# Patient Record
Sex: Female | Born: 1975 | Hispanic: No | Smoking: Never smoker
Health system: Southern US, Community
[De-identification: ages and names within clinical notes are randomized; demographics above are authoritative.]

## PROBLEM LIST (undated history)

## (undated) DIAGNOSIS — G43909 Migraine, unspecified, not intractable, without status migrainosus: Secondary | ICD-10-CM

## (undated) DIAGNOSIS — F419 Anxiety disorder, unspecified: Secondary | ICD-10-CM

## (undated) DIAGNOSIS — F329 Major depressive disorder, single episode, unspecified: Secondary | ICD-10-CM

## (undated) DIAGNOSIS — N289 Disorder of kidney and ureter, unspecified: Secondary | ICD-10-CM

## (undated) DIAGNOSIS — F32A Depression, unspecified: Secondary | ICD-10-CM

## (undated) HISTORY — PX: OOPHORECTOMY: SHX86

## (undated) HISTORY — PX: GASTRIC BYPASS: SHX52

---

## 2014-01-13 ENCOUNTER — Emergency Department (HOSPITAL_BASED_OUTPATIENT_CLINIC_OR_DEPARTMENT_OTHER)

## 2014-01-13 ENCOUNTER — Emergency Department (HOSPITAL_BASED_OUTPATIENT_CLINIC_OR_DEPARTMENT_OTHER)
Admission: EM | Admit: 2014-01-13 | Discharge: 2014-01-13 | Disposition: A | Discharge status: Home / Self Care | Attending: Emergency Medicine | Admitting: Emergency Medicine

## 2014-01-13 ENCOUNTER — Encounter (HOSPITAL_BASED_OUTPATIENT_CLINIC_OR_DEPARTMENT_OTHER): Payer: Self-pay | Admitting: Emergency Medicine

## 2014-01-13 DIAGNOSIS — R51 Headache: Secondary | ICD-10-CM | POA: Insufficient documentation

## 2014-01-13 DIAGNOSIS — Z8679 Personal history of other diseases of the circulatory system: Secondary | ICD-10-CM | POA: Insufficient documentation

## 2014-01-13 DIAGNOSIS — Z3202 Encounter for pregnancy test, result negative: Secondary | ICD-10-CM | POA: Insufficient documentation

## 2014-01-13 DIAGNOSIS — R519 Headache, unspecified: Secondary | ICD-10-CM

## 2014-01-13 DIAGNOSIS — Z79899 Other long term (current) drug therapy: Secondary | ICD-10-CM | POA: Insufficient documentation

## 2014-01-13 HISTORY — DX: Migraine, unspecified, not intractable, without status migrainosus: G43.909

## 2014-01-13 LAB — PREGNANCY, URINE: PREG TEST UR: NEGATIVE

## 2014-01-13 LAB — CBC WITH DIFFERENTIAL/PLATELET
Basophils Absolute: 0 10*3/uL (ref 0.0–0.1)
Basophils Relative: 0 % (ref 0–1)
Eosinophils Absolute: 0.1 10*3/uL (ref 0.0–0.7)
Eosinophils Relative: 1 % (ref 0–5)
HEMATOCRIT: 35.9 % — AB (ref 36.0–46.0)
Hemoglobin: 11.4 g/dL — ABNORMAL LOW (ref 12.0–15.0)
LYMPHS PCT: 44 % (ref 12–46)
Lymphs Abs: 3.2 10*3/uL (ref 0.7–4.0)
MCH: 27.3 pg (ref 26.0–34.0)
MCHC: 31.8 g/dL (ref 30.0–36.0)
MCV: 86.1 fL (ref 78.0–100.0)
MONO ABS: 0.5 10*3/uL (ref 0.1–1.0)
Monocytes Relative: 7 % (ref 3–12)
NEUTROS ABS: 3.4 10*3/uL (ref 1.7–7.7)
Neutrophils Relative %: 48 % (ref 43–77)
PLATELETS: 316 10*3/uL (ref 150–400)
RBC: 4.17 MIL/uL (ref 3.87–5.11)
RDW: 12.2 % (ref 11.5–15.5)
WBC: 7.3 10*3/uL (ref 4.0–10.5)

## 2014-01-13 LAB — BASIC METABOLIC PANEL
ANION GAP: 18 — AB (ref 5–15)
BUN: 5 mg/dL — ABNORMAL LOW (ref 6–23)
CHLORIDE: 96 meq/L (ref 96–112)
CO2: 24 meq/L (ref 19–32)
Calcium: 10.1 mg/dL (ref 8.4–10.5)
Creatinine, Ser: 0.7 mg/dL (ref 0.50–1.10)
GFR calc Af Amer: >90 mL/min (ref >90)
GFR calc non Af Amer: >90 mL/min (ref >90)
Glucose, Bld: 88 mg/dL (ref 70–99)
Potassium: 3.7 mEq/L (ref 3.7–5.3)
SODIUM: 138 meq/L (ref 137–147)

## 2014-01-13 MED ORDER — DEXAMETHASONE SODIUM PHOSPHATE 10 MG/ML IJ SOLN: 10.0000 mg | Freq: Once | INTRAMUSCULAR | Status: AC

## 2014-01-13 MED ORDER — KETOROLAC TROMETHAMINE 30 MG/ML IJ SOLN
30.0000 mg | Freq: Once | INTRAMUSCULAR | Status: AC
  Administered 2014-01-13: 30 mg via INTRAVENOUS
  Filled 2014-01-13: qty 1

## 2014-01-13 MED ORDER — METOCLOPRAMIDE HCL 5 MG/ML IJ SOLN
10.0000 mg | Freq: Once | INTRAMUSCULAR | Status: AC
  Administered 2014-01-13: 10 mg via INTRAVENOUS
  Filled 2014-01-13: qty 2

## 2014-01-13 MED ORDER — PROMETHAZINE HCL 25 MG PO TABS: 25.0000 mg | ORAL_TABLET | Freq: Four times a day (QID) | ORAL | Status: AC | PRN

## 2014-01-13 MED ORDER — BUTALBITAL-APAP-CAFFEINE 50-325-40 MG PO TABS: 1.0000 | ORAL_TABLET | Freq: Four times a day (QID) | ORAL | Status: AC | PRN

## 2014-01-13 MED ORDER — DEXAMETHASONE SODIUM PHOSPHATE 4 MG/ML IJ SOLN
INTRAMUSCULAR | Status: AC
Start: 2014-01-13 — End: 2014-01-13
  Administered 2014-01-13: 10 mg
  Filled 2014-01-13: qty 3

## 2014-01-13 MED ORDER — DIPHENHYDRAMINE HCL 50 MG/ML IJ SOLN
25.0000 mg | Freq: Once | INTRAMUSCULAR | Status: AC
  Administered 2014-01-13: 25 mg via INTRAVENOUS
  Filled 2014-01-13: qty 1

## 2014-01-13 MED ORDER — SUMATRIPTAN SUCCINATE 6 MG/0.5ML ~~LOC~~ SOLN
6.0000 mg | Freq: Once | SUBCUTANEOUS | Status: AC
  Administered 2014-01-13: 6 mg via SUBCUTANEOUS
  Filled 2014-01-13: qty 0.5

## 2014-01-13 MED ORDER — SODIUM CHLORIDE 0.9 % IV BOLUS (SEPSIS)
1000.0000 mL | Freq: Once | INTRAVENOUS | Status: AC
  Administered 2014-01-13: 1000 mL via INTRAVENOUS

## 2014-01-13 MED ORDER — IBUPROFEN 800 MG PO TABS
800.0000 mg | ORAL_TABLET | Freq: Three times a day (TID) | ORAL | Status: AC
Start: 2014-01-13 — End: ?

## 2014-01-13 NOTE — ED Provider Notes (Signed)
CSN: [id number]     Arrival date & time 01/13/14  1512 History   First MD Initiated Contact with Patient 01/13/14 1521     Chief Complaint  Patient presents with  . Headache     (Consider location/radiation/quality/duration/timing/severity/associated sxs/prior Treatment) HPI Comments: Patient presents to ER for evaluation of migraine headache for 1 week. Patient reports that she has had a bifrontal throbbing headache with light sensitivity for one week. She was seen at an urgent care a couple of days ago, given Toradol, Phenergan and Benadryl but did not have any relief. Patient reports that she has a history of migraines, but has never had one for this long before. She is not expressing any fever, neck pain or stiffness.  Patient is a 38 y.o. female presenting with headaches.  Headache Associated symptoms: no fever     Past Medical History  Diagnosis Date  . Migraine    Past Surgical History  Procedure Laterality Date  . Gastric bypass    . Oophorectomy     No family history on file. History  Substance Use Topics  . Smoking status: Never Smoker   . Smokeless tobacco: Not on file  . Alcohol Use: Yes     Comment: occ   OB History   Grav Para Term Preterm Abortions TAB SAB Ect Mult Living                 Review of Systems  Constitutional: Negative for fever.  Neurological: Positive for headaches.  All other systems reviewed and are negative.     Allergies  Review of patient's allergies indicates no known allergies.  Home Medications   Prior to Admission medications   Medication Sig Start Date End Date Taking? Authorizing Provider  levonorgestrel-ethinyl estradiol (SEASONALE,INTROVALE,JOLESSA) 0.15-0.03 MG tablet Take 1 tablet by mouth daily.   Yes Historical Provider, MD   BP 109/73  Pulse 61  Temp(Src) 98.2 F (36.8 C) (Oral)  Resp 18  Ht 5\' 1"  (1.549 m)  Wt 120 lb (54.432 kg)  BMI 22.69 kg/m2  SpO2 100%  LMP 01/06/2014 Physical Exam  Constitutional:  She is oriented to person, place, and time. She appears well-developed and well-nourished. No distress.  HENT:  Head: Normocephalic and atraumatic.  Right Ear: Hearing normal.  Left Ear: Hearing normal.  Nose: Nose normal.  Mouth/Throat: Oropharynx is clear and moist and mucous membranes are normal.  Eyes: Conjunctivae and EOM are normal. Pupils are equal, round, and reactive to light.  Neck: Normal range of motion. Neck supple.  Cardiovascular: Regular rhythm, S1 normal and S2 normal.  Exam reveals no gallop and no friction rub.   No murmur heard. Pulmonary/Chest: Effort normal and breath sounds normal. No respiratory distress. She exhibits no tenderness.  Abdominal: Soft. Normal appearance and bowel sounds are normal. There is no hepatosplenomegaly. There is no tenderness. There is no rebound, no guarding, no tenderness at McBurney's point and negative Murphy's sign. No hernia.  Musculoskeletal: Normal range of motion.  Neurological: She is alert and oriented to person, place, and time. She has normal strength. No cranial nerve deficit or sensory deficit. Coordination normal. GCS eye subscore is 4. GCS verbal subscore is 5. GCS motor subscore is 6.  Skin: Skin is warm, dry and intact. No rash noted. No cyanosis.  Psychiatric: She has a normal mood and affect. Her speech is normal and behavior is normal. Thought content normal.    ED Course  Procedures (including critical care time) Labs Review Labs Reviewed  CBC WITH DIFFERENTIAL - Abnormal; Notable for the following:    Hemoglobin 11.4 (*)    HCT 35.9 (*)    All other components within normal limits  BASIC METABOLIC PANEL - Abnormal; Notable for the following:    BUN 5 (*)    Anion gap 18 (*)    All other components within normal limits  PREGNANCY, URINE    Imaging Review Ct Head Wo Contrast  01/13/2014   CLINICAL DATA:  Headache.  EXAM: CT HEAD WITHOUT CONTRAST  TECHNIQUE: Contiguous axial images were obtained from the base of  the skull through the vertex without intravenous contrast.  COMPARISON:  None.  FINDINGS: Bony calvarium appears intact. No mass effect or midline shift is noted. Ventricular size is within normal limits. There is no evidence of mass lesion, hemorrhage or acute infarction.  IMPRESSION: Normal head CT.   Electronically Signed   By: Roque Lias M.D.   On: 01/13/2014 16:23     EKG Interpretation None      MDM   Final diagnoses:  None   headache, likely migraine  Patient with previous history of migraines presents to the ER for evaluation of a headache that has lasted one week. She reports that the length of the headaches he has had is unusual for her, her headaches have never been this long. The description of the headache, however, does resemble migraine headache tonight she has light sensitivity, nausea, bifrontal throbbing. She had a normal neurologic examination. A head CT was performed because of the unusual nature of her headache and was negative.  I did discuss the possibility of lumbar puncture with the patient both at initial evaluation as well as after results of testing was available. Patient was informed that I could not rule out subarachnoid hemorrhage with 100% certainty with CAT scan alone based on the length of time her head has been hurting. She was informed of the risks of lumbar puncture, specifically post LP headache. Patient does not wish to undergo the procedure at this time. I did feel that the patient was extremely low likelihood for subarachnoid hemorrhage based on her presentation and examination, and it was agreed that the patient would be discharged, told to come back to the ER for headache worsens. She will be referred to neurology.    Gilda Crease, MD 01/13/14 1640

## 2014-01-13 NOTE — Discharge Instructions (Signed)
If Headache suddenly worsens or you develop numbness, tingling, weakness in the face or one side of your body, return to ER for repeat patient.  Migraine Headache A migraine headache is an intense, throbbing pain on one or both sides of your head. A migraine can last for 30 minutes to several hours. CAUSES  The exact cause of a migraine headache is not always known. However, a migraine may be caused when nerves in the brain become irritated and release chemicals that cause inflammation. This causes pain. Certain things may also trigger migraines, such as:  Alcohol.  Smoking.  Stress.  Menstruation.  Aged cheeses.  Foods or drinks that contain nitrates, glutamate, aspartame, or tyramine.  Lack of sleep.  Chocolate.  Caffeine.  Hunger.  Physical exertion.  Fatigue.  Medicines used to treat chest pain (nitroglycerine), birth control pills, estrogen, and some blood pressure medicines. SIGNS AND SYMPTOMS  Pain on one or both sides of your head.  Pulsating or throbbing pain.  Severe pain that prevents daily activities.  Pain that is aggravated by any physical activity.  Nausea, vomiting, or both.  Dizziness.  Pain with exposure to bright lights, loud noises, or activity.  General sensitivity to bright lights, loud noises, or smells. Before you get a migraine, you may get warning signs that a migraine is coming (aura). An aura may include:  Seeing flashing lights.  Seeing bright spots, halos, or zig-zag lines.  Having tunnel vision or blurred vision.  Having feelings of numbness or tingling.  Having trouble talking.  Having muscle weakness. DIAGNOSIS  A migraine headache is often diagnosed based on:  Symptoms.  Physical exam.  A CT scan or MRI of your head. These imaging tests cannot diagnose migraines, but they can help rule out other causes of headaches. TREATMENT Medicines may be given for pain and nausea. Medicines can also be given to help prevent  recurrent migraines.  HOME CARE INSTRUCTIONS  Only take over-the-counter or prescription medicines for pain or discomfort as directed by your health care provider. The use of long-term narcotics is not recommended.  Lie down in a dark, quiet room when you have a migraine.  Keep a journal to find out what may trigger your migraine headaches. For example, write down:  What you eat and drink.  How much sleep you get.  Any change to your diet or medicines.  Limit alcohol consumption.  Quit smoking if you smoke.  Get 7-9 hours of sleep, or as recommended by your health care provider.  Limit stress.  Keep lights dim if bright lights bother you and make your migraines worse. SEEK IMMEDIATE MEDICAL CARE IF:   Your migraine becomes severe.  You have a fever.  You have a stiff neck.  You have vision loss.  You have muscular weakness or loss of muscle control.  You start losing your balance or have trouble walking.  You feel faint or pass out.  You have severe symptoms that are different from your first symptoms. MAKE SURE YOU:   Understand these instructions.  Will watch your condition.  Will get help right away if you are not doing well or get worse. Document Released: 06/16/2005 Document Revised: 04/06/2013 Document Reviewed: 02/21/2013 [facility] Patient Information 2015 [employer], [place]. This information is not intended to replace advice given to you by your health care provider. Make sure you discuss any questions you have with your health care provider.

## 2014-01-13 NOTE — ED Notes (Signed)
Migraine x 1 week. Sts went to clinic, given toradol, phenergan and benadryl, no relief. Frontal, left headache. Never this bad.

## 2014-06-27 ENCOUNTER — Encounter (HOSPITAL_BASED_OUTPATIENT_CLINIC_OR_DEPARTMENT_OTHER): Payer: Self-pay | Admitting: *Deleted

## 2014-06-27 ENCOUNTER — Emergency Department (HOSPITAL_BASED_OUTPATIENT_CLINIC_OR_DEPARTMENT_OTHER)
Admission: EM | Admit: 2014-06-27 | Discharge: 2014-06-27 | Disposition: A | Discharge status: Home / Self Care | Attending: Emergency Medicine | Admitting: Emergency Medicine

## 2014-06-27 DIAGNOSIS — F419 Anxiety disorder, unspecified: Secondary | ICD-10-CM | POA: Insufficient documentation

## 2014-06-27 DIAGNOSIS — Q600 Renal agenesis, unilateral: Secondary | ICD-10-CM | POA: Insufficient documentation

## 2014-06-27 DIAGNOSIS — F329 Major depressive disorder, single episode, unspecified: Secondary | ICD-10-CM | POA: Insufficient documentation

## 2014-06-27 DIAGNOSIS — R300 Dysuria: Secondary | ICD-10-CM | POA: Diagnosis not present

## 2014-06-27 DIAGNOSIS — Z3202 Encounter for pregnancy test, result negative: Secondary | ICD-10-CM | POA: Diagnosis not present

## 2014-06-27 DIAGNOSIS — Z792 Long term (current) use of antibiotics: Secondary | ICD-10-CM | POA: Diagnosis not present

## 2014-06-27 DIAGNOSIS — N390 Urinary tract infection, site not specified: Secondary | ICD-10-CM | POA: Diagnosis not present

## 2014-06-27 DIAGNOSIS — Z8679 Personal history of other diseases of the circulatory system: Secondary | ICD-10-CM | POA: Insufficient documentation

## 2014-06-27 DIAGNOSIS — R109 Unspecified abdominal pain: Secondary | ICD-10-CM | POA: Diagnosis present

## 2014-06-27 HISTORY — DX: Anxiety disorder, unspecified: F41.9

## 2014-06-27 HISTORY — DX: Major depressive disorder, single episode, unspecified: F32.9

## 2014-06-27 HISTORY — DX: Disorder of kidney and ureter, unspecified: N28.9

## 2014-06-27 HISTORY — DX: Depression, unspecified: F32.A

## 2014-06-27 LAB — URINALYSIS, ROUTINE W REFLEX MICROSCOPIC
Glucose, UA: NEGATIVE mg/dL
Hgb urine dipstick: NEGATIVE
Ketones, ur: >80 mg/dL — AB
NITRITE: POSITIVE — AB
Protein, ur: 30 mg/dL — AB
SPECIFIC GRAVITY, URINE: 1.028 (ref 1.005–1.030)
UROBILINOGEN UA: 4 mg/dL — AB (ref 0.0–1.0)
pH: 5 (ref 5.0–8.0)

## 2014-06-27 LAB — PREGNANCY, URINE: PREG TEST UR: NEGATIVE

## 2014-06-27 LAB — URINE MICROSCOPIC-ADD ON

## 2014-06-27 MED ORDER — CEPHALEXIN 500 MG PO CAPS: 500.0000 mg | ORAL_CAPSULE | Freq: Three times a day (TID) | ORAL | Status: AC

## 2014-06-27 MED ORDER — HYDROCODONE-ACETAMINOPHEN 5-325 MG PO TABS
1.0000 | ORAL_TABLET | Freq: Once | ORAL | Status: AC
  Administered 2014-06-27: 1 via ORAL
  Filled 2014-06-27: qty 1

## 2014-06-27 MED ORDER — CEFTRIAXONE SODIUM 1 G IJ SOLR
1.0000 g | Freq: Once | INTRAMUSCULAR | Status: AC
  Administered 2014-06-27: 1 g via INTRAMUSCULAR
  Filled 2014-06-27: qty 10

## 2014-06-27 NOTE — Discharge Instructions (Signed)
Take Tylenol for pain, stay well-hydrated. Change your antibiotics to Keflex, stop taking Cipro. Follow-up for culture results in 2 days with local physician.  If you were given medicines take as directed.  If you are on coumadin or contraceptives realize their levels and effectiveness is altered by many different medicines.  If you have any reaction (rash, tongues swelling, other) to the medicines stop taking and see a physician.   Please follow up as directed and return to the ER or see a physician for new or worsening symptoms.  Thank you. Filed Vitals:   06/27/14 1816  BP: 108/93  Pulse: 72  Temp: 98.4 F (36.9 C)  TempSrc: Oral  Height: 4\' 10"  (1.473 m)  Weight: 108 lb (48.988 kg)  SpO2: 100%

## 2014-06-27 NOTE — ED Notes (Signed)
Pt c/o left flank pain x 2 days DX UTI x 1 dayo taking cipro

## 2014-06-27 NOTE — ED Notes (Signed)
EDP at bedside  

## 2014-06-27 NOTE — ED Provider Notes (Addendum)
CSN: [id number]     Arrival date & time 06/27/14  1807 History  This chart was scribed for Enid SkeensJoshua M Maryhelen Lindler, MD by Roxy Cedarhandni Bhalodia, ED Scribe. This patient was seen in room MH12/MH12 and the patient's care was started at 6:39 PM.  Chief Complaint  Patient presents with  . Flank Pain   Patient is a 38 y.o. female presenting with flank pain. The history is provided by the patient. No language interpreter was used.  Flank Pain This is a new problem. The current episode started 2 days ago. Associated symptoms include abdominal pain. Nothing aggravates the symptoms. Nothing relieves the symptoms. Treatments tried: Cipro.   HPI Comments: Angela Silva is a 38 y.o. female who presents to the Emergency Department complaining of gradually worsening left sided flank pain that began 2 days ago. She reports associated dysuria. She states that she was diagnosed with UTI and is currently taking Cipro 500mg . She denies history of kidney stones, rashes, arthralgia, fevers, chills. 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 Patient has no known allergies.  Past Medical History  Diagnosis Date  . Migraine   . Anxiety   . Depressed   . Kidney disorder    Past Surgical History  Procedure Laterality Date  . Gastric bypass    . Oophorectomy     History reviewed. No pertinent family history. History  Substance Use Topics  . Smoking status: Never Smoker   . Smokeless tobacco: Not on file  . Alcohol Use: Yes     Comment: occ   OB History    No data available     Review of Systems  Gastrointestinal: Positive for abdominal pain.  Genitourinary: Positive for flank pain.  All other systems reviewed and are negative.  Allergies  Review of patient's allergies indicates no known allergies.  Home Medications   Prior to Admission medications   Medication Sig Start Date End Date Taking? Authorizing Provider  ciprofloxacin (CIPRO) 500 MG tablet Take 500 mg by mouth 2 (two) times  daily.   Yes Historical Provider, MD  citalopram (CELEXA) 20 MG tablet Take 20 mg by mouth daily.   Yes Historical Provider, MD  clonazePAM (KLONOPIN) 0.5 MG tablet Take 0.5 mg by mouth 2 (two) times daily as needed for anxiety.   Yes Historical Provider, MD  butalbital-acetaminophen-caffeine (FIORICET) 50-325-40 MG per tablet Take 1-2 tablets by mouth every 6 (six) hours as needed for headache. 01/13/14 01/13/15  Gilda Creasehristopher J. Pollina, MD  cephALEXin (KEFLEX) 500 MG capsule Take 1 capsule (500 mg total) by mouth 3 (three) times daily. 06/28/14   Enid SkeensJoshua M Makyra Corprew, MD  ibuprofen (ADVIL,MOTRIN) 800 MG tablet Take 1 tablet (800 mg total) by mouth 3 (three) times daily. 01/13/14   Gilda Creasehristopher J. Pollina, MD  levonorgestrel-ethinyl estradiol (SEASONALE,INTROVALE,JOLESSA) 0.15-0.03 MG tablet Take 1 tablet by mouth daily.    Historical Provider, MD  promethazine (PHENERGAN) 25 MG tablet Take 1 tablet (25 mg total) by mouth every 6 (six) hours as needed for nausea or vomiting. 01/13/14   Gilda Creasehristopher J. Pollina, MD   Triage Vitals: BP 108/93 mmHg  Pulse 72  Temp(Src) 98.4 F (36.9 C) (Oral)  Ht 4\' 10"  (1.473 m)  Wt 108 lb (48.988 kg)  BMI 22.58 kg/m2  SpO2 100%  LMP 06/25/2014  Physical Exam  Constitutional: She is oriented to person, place, and time. She appears well-developed and well-nourished. No distress.  HENT:  Head: Normocephalic and atraumatic.  Eyes: Conjunctivae and EOM  are normal.  Neck: Neck supple. No tracheal deviation present.  Cardiovascular: Normal rate.   Pulmonary/Chest: Effort normal. No respiratory distress.  Abdominal: Soft. She exhibits no distension. There is tenderness. There is no rebound and no guarding.  Mild discomfort to suprapubic. No other focal tenderness.  Musculoskeletal: Normal range of motion. She exhibits tenderness (no right flank tenderness).  Pain to posterior left lower flank. No midline spinal tenderness.  Neurological: She is alert and oriented to  person, place, and time.  Skin: Skin is warm and dry.  Psychiatric: She has a normal mood and affect. Her behavior is normal.  Nursing note and vitals reviewed.  ED Course  Procedures (including critical care time) Emergency Focused Ultrasound Exam Limited retroperitoneal ultrasound of kidneys  Performed and interpreted by Dr. Jodi MourningZavitz Indication: flank pain Focused abdominal ultrasound with right kidney imaged in transverse and longitudinal planes in real-time. Interpretation: no hydronephrosis visualized, no left kidney visualized.   Images archived electronically  DIAGNOSTIC STUDIES: Oxygen Saturation is 100% on RA, normal by my interpretation.    COORDINATION OF CARE: 7:05 PM- Discussed plans to order diagnostic urinalysis, pregnancy test and ultrasound imaging.Pt advised of plan for treatment and pt agrees.  Labs Review Labs Reviewed  URINALYSIS, ROUTINE W REFLEX MICROSCOPIC - Abnormal; Notable for the following:    Color, Urine RED (*)    APPearance CLOUDY (*)    Bilirubin Urine MODERATE (*)    Ketones, ur >80 (*)    Protein, ur 30 (*)    Urobilinogen, UA 4.0 (*)    Nitrite POSITIVE (*)    Leukocytes, UA MODERATE (*)    All other components within normal limits  URINE MICROSCOPIC-ADD ON - Abnormal; Notable for the following:    Squamous Epithelial / LPF FEW (*)    Bacteria, UA FEW (*)    All other components within normal limits  URINE CULTURE  PREGNANCY, URINE   Imaging Review No results found.   EKG Interpretation None     MDM   Final diagnoses:  Congenital single kidney  Dysuria  UTI (lower urinary tract infection)    I personally performed the services described in this documentation, which was scribed in my presence. The recorded information has been reviewed and is accurate.  Patient presents with left lower flank pain and dysuria. Patient unsure which kidney she has, congenital loss of kidney that was discovered on imaging. Bedside ultrasound  showed right kidney without hydro-, no left kidney. Well appearing in ED.    Urine infected. Discussed likely resistant to Cipro. Rocephin ordered, patient well-appearing without systemic symptoms. Discussed close follow-up for culture result and changing antibiotics to Keflex.  Results and differential diagnosis were discussed with the patient/parent/guardian. Close follow up outpatient was discussed, comfortable with the plan.   Medications  cefTRIAXone (ROCEPHIN) injection 1 g (not administered)  HYDROcodone-acetaminophen (NORCO/VICODIN) 5-325 MG per tablet 1 tablet (not administered)    Filed Vitals:   06/27/14 1816  BP: 108/93  Pulse: 72  Temp: 98.4 F (36.9 C)  TempSrc: Oral  Height: 4\' 10"  (1.473 m)  Weight: 108 lb (48.988 kg)  SpO2: 100%    Final diagnoses:  Congenital single kidney  Dysuria  UTI (lower urinary tract infection)      Enid SkeensJoshua M Sydell Prowell, MD 06/27/14 1906  Enid SkeensJoshua M Keniah Klemmer, MD 06/27/14 1911

## 2014-06-28 LAB — URINE CULTURE
Colony Count: NO GROWTH
Culture: NO GROWTH

## 2016-01-11 IMAGING — CT CT HEAD W/O CM
1 series · 16 of 30 positions shown, 20 images · non-contrast
Comparison: None.

CLINICAL DATA: Headache.

EXAM:
CT HEAD WITHOUT CONTRAST
TECHNIQUE: Contiguous axial images were obtained from the base of the skull
through the vertex without intravenous contrast.

[Series 2: head 4.8 h37s · axial · 0.42mm/px · z∈[-151,-18]mm · 16 of 32 slices shown, 20 images]
[im 2/32  brain]
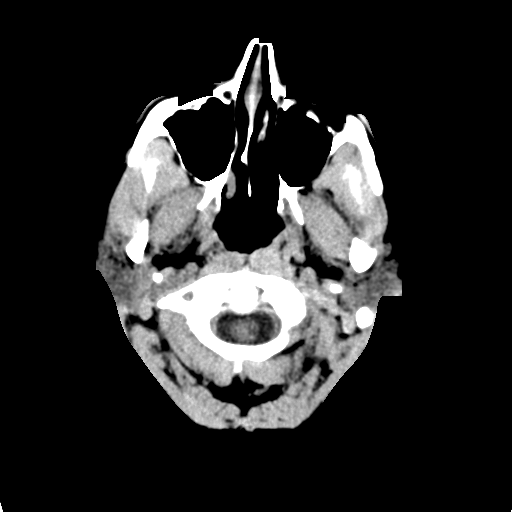
[im 2/32  bone]
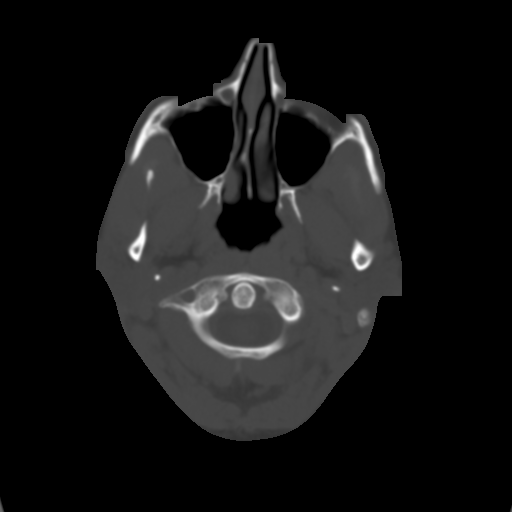
[im 4/32  brain]
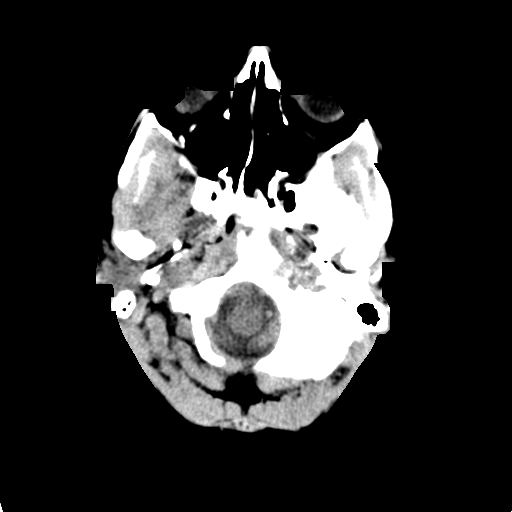
[im 6/32  brain]
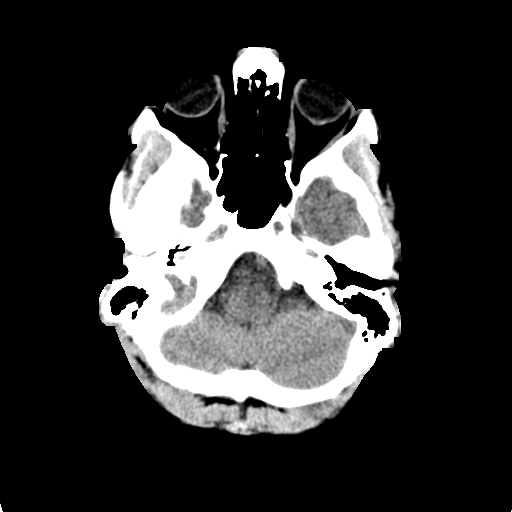
[im 8/32  brain]
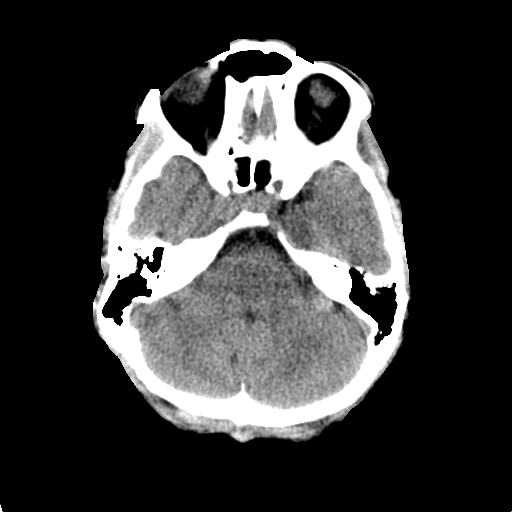
[im 9/32  brain]
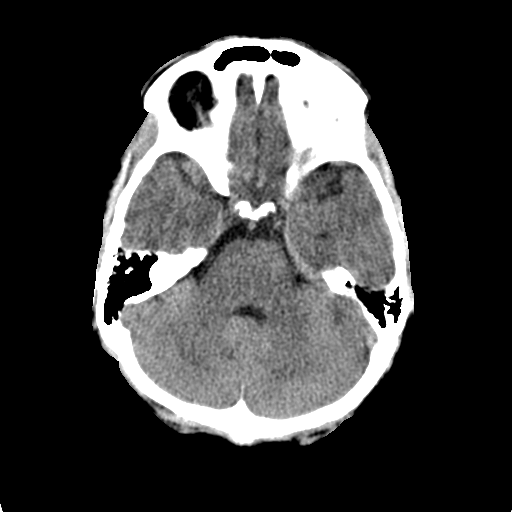
[im 9/32  bone]
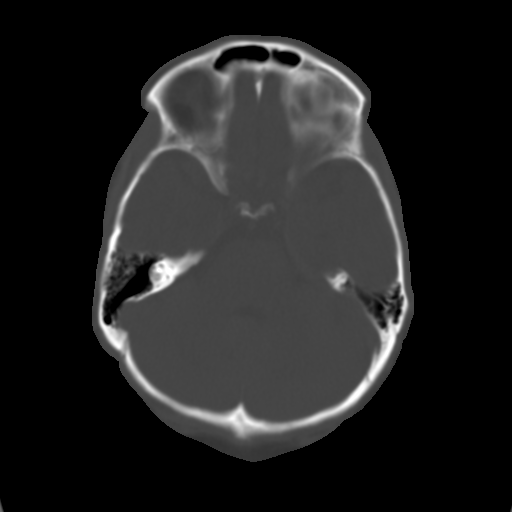
[im 11/32  brain]
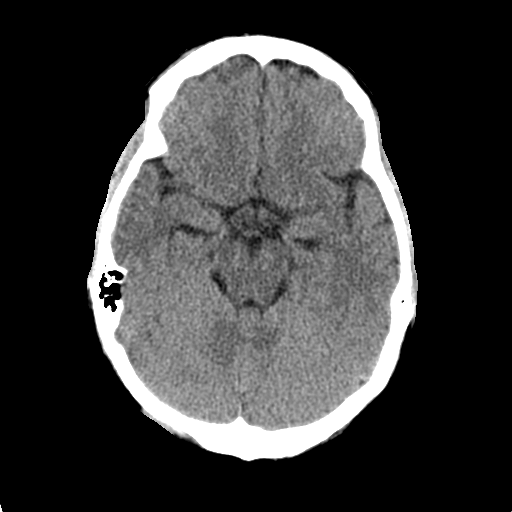
[im 13/32  brain]
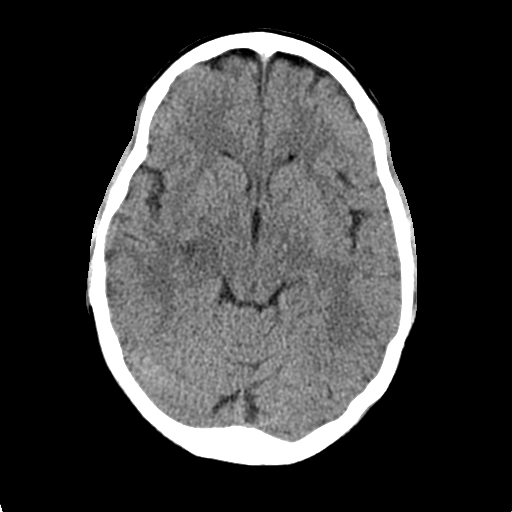
[im 15/32  brain]
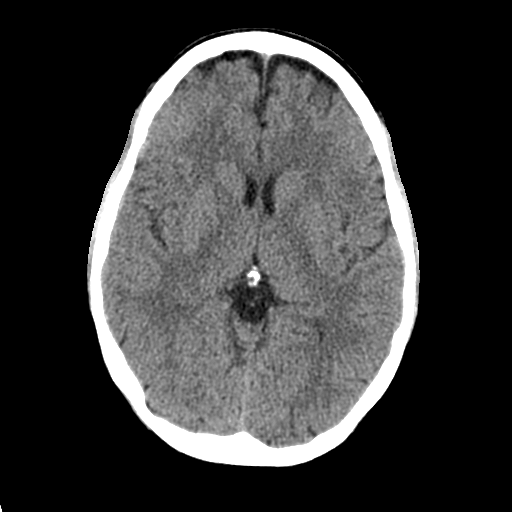
[im 17/32  brain]
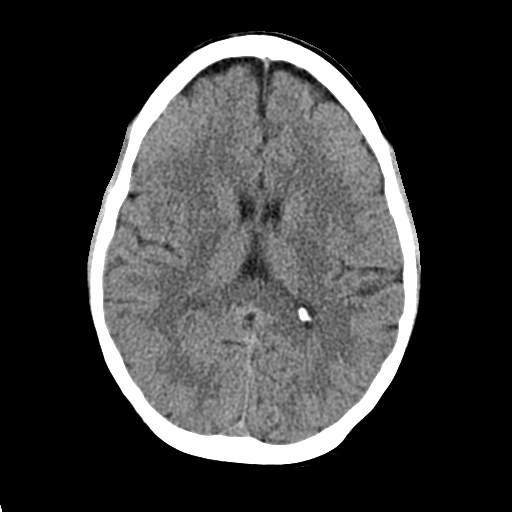
[im 17/32  bone]
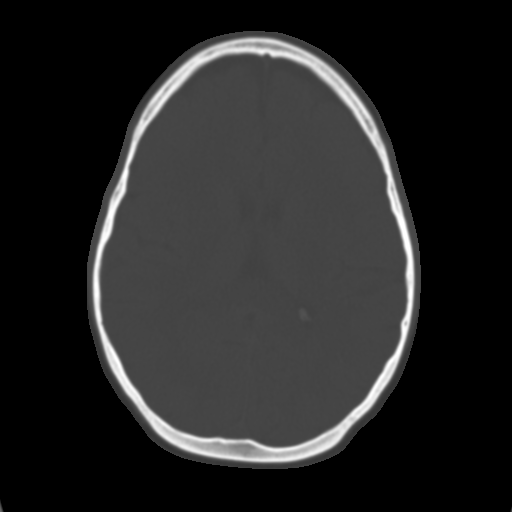
[im 19/32  brain]
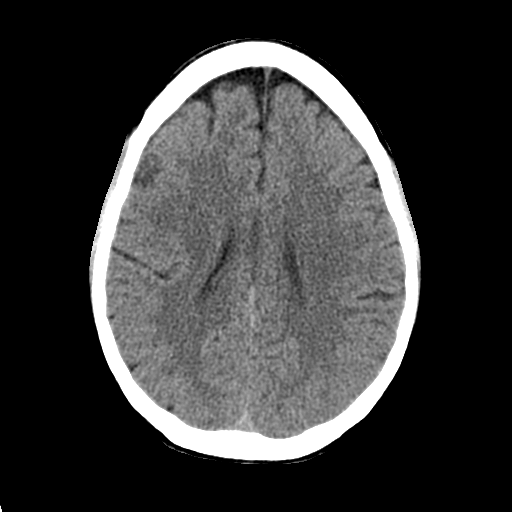
[im 21/32  brain]
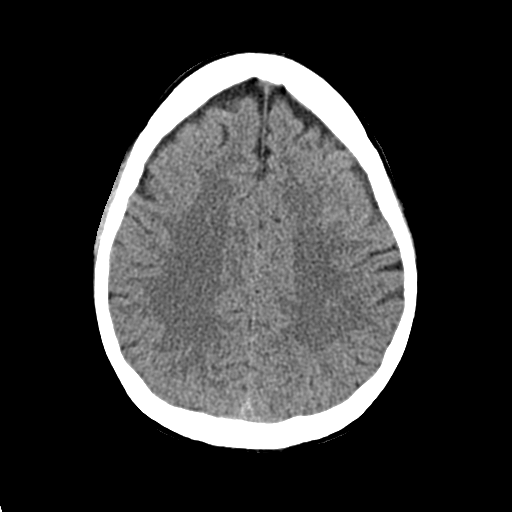
[im 23/32  brain]
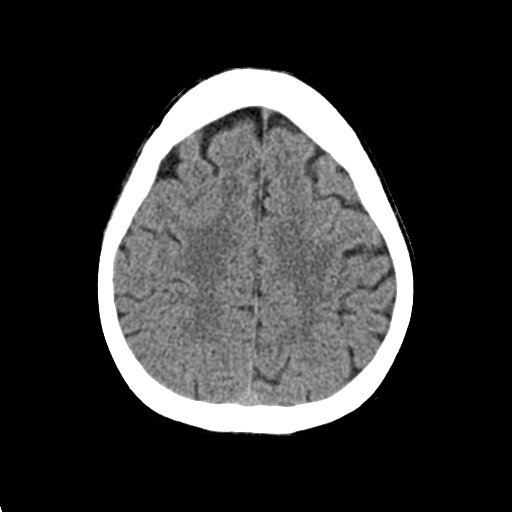
[im 24/32  brain]
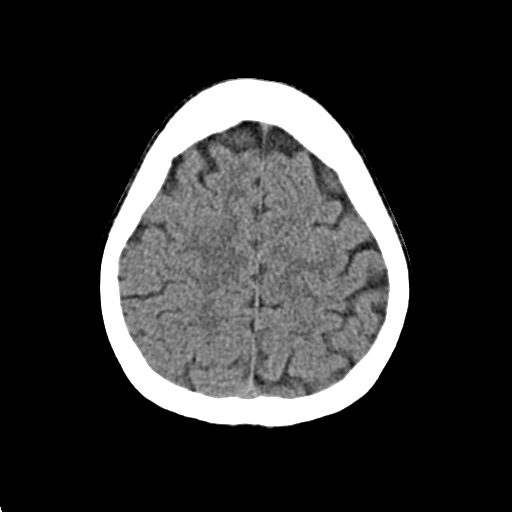
[im 24/32  bone]
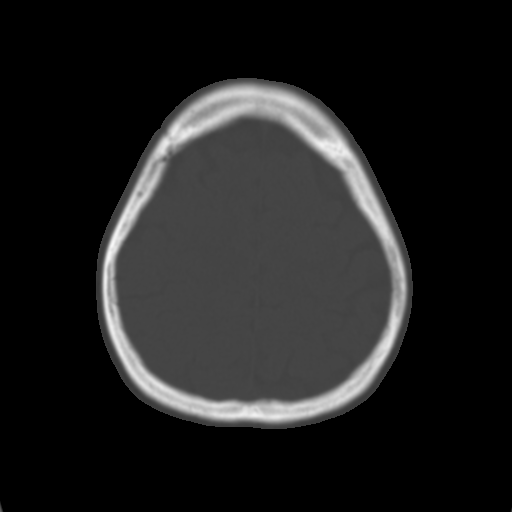
[im 26/32  brain]
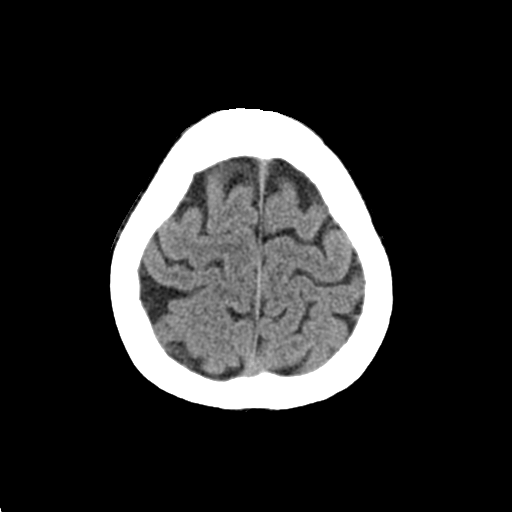
[im 28/32  brain]
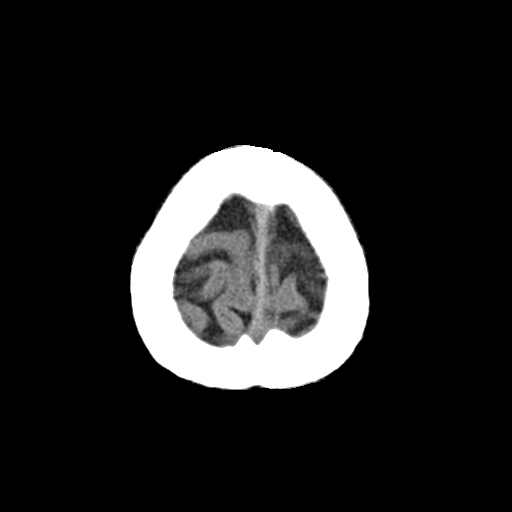
[im 30/32  brain]
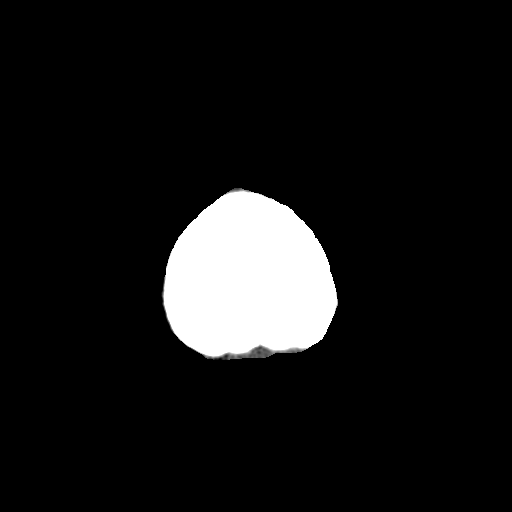

[16 of 30 positions shown; findings below may reference images not displayed]

FINDINGS: Bony calvarium appears intact. No mass effect or midline shift is
noted. Ventricular size is within normal limits. There is no
evidence of mass lesion, hemorrhage or acute infarction.
IMPRESSION: Normal head CT.
